# Patient Record
Sex: Female | Born: 1965 | Race: White | Hispanic: No | Marital: Married | Smoking: Never smoker
Health system: Southern US, Community
[De-identification: ages and names within clinical notes are randomized; demographics above are authoritative.]

---

## 1998-03-12 ENCOUNTER — Inpatient Hospital Stay (HOSPITAL_COMMUNITY): Admission: AD | Admit: 1998-03-12 | Discharge: 1998-03-14 | Payer: Self-pay | Admitting: Obstetrics & Gynecology

## 2002-02-14 ENCOUNTER — Inpatient Hospital Stay (HOSPITAL_COMMUNITY): Admission: EM | Admit: 2002-02-14 | Discharge: 2002-02-18 | Payer: Self-pay | Admitting: *Deleted

## 2002-02-21 ENCOUNTER — Encounter: Admission: RE | Admit: 2002-02-21 | Discharge: 2002-02-21 | Payer: Self-pay | Admitting: Internal Medicine

## 2002-03-12 ENCOUNTER — Emergency Department (HOSPITAL_COMMUNITY): Admission: EM | Admit: 2002-03-12 | Discharge: 2002-03-12 | Payer: Self-pay | Admitting: Emergency Medicine

## 2002-05-28 ENCOUNTER — Other Ambulatory Visit: Admission: RE | Admit: 2002-05-28 | Discharge: 2002-05-28 | Payer: Self-pay | Admitting: Obstetrics and Gynecology

## 2004-06-07 ENCOUNTER — Other Ambulatory Visit: Admission: RE | Admit: 2004-06-07 | Discharge: 2004-06-07 | Payer: Self-pay | Admitting: Obstetrics and Gynecology

## 2005-06-26 ENCOUNTER — Other Ambulatory Visit: Admission: RE | Admit: 2005-06-26 | Discharge: 2005-06-26 | Payer: Self-pay | Admitting: Obstetrics and Gynecology

## 2006-09-10 ENCOUNTER — Encounter (INDEPENDENT_AMBULATORY_CARE_PROVIDER_SITE_OTHER): Payer: Self-pay | Admitting: Specialist

## 2006-09-10 ENCOUNTER — Ambulatory Visit (HOSPITAL_COMMUNITY): Admission: RE | Admit: 2006-09-10 | Discharge: 2006-09-11 | Payer: Self-pay | Admitting: Obstetrics and Gynecology

## 2007-07-11 ENCOUNTER — Inpatient Hospital Stay (HOSPITAL_COMMUNITY): Admission: AD | Admit: 2007-07-11 | Discharge: 2007-07-11 | Payer: Self-pay | Admitting: Obstetrics and Gynecology

## 2008-12-09 IMAGING — US US TRANSVAGINAL NON-OB
1 series · 13 of 25 positions shown · non-contrast
Comparison: none

CLINICAL DATA: Left-sided pelvic pain.  Prior hysterectomy 
TRANSABDOMINAL AND TRANSVAGINAL PELVIC ULTRASOUND ? 07/11/07:
TECHNIQUE: Both transabdominal and transvaginal ultrasound examinations of the pelvis were performed including evaluation of the uterus, ovaries, adnexal regions, and pelvic cul-de-sac. 
ULTRASOUND ARTERIAL AND VENOUS DOPPLER ? 07/11/07:
TECHNIQUE: Arterial and venous assessment was carried out with color flow Doppler and waveform analysis.

[Series 1: us pelvis complete · 13 of 52 slices shown]
[im 1/52]
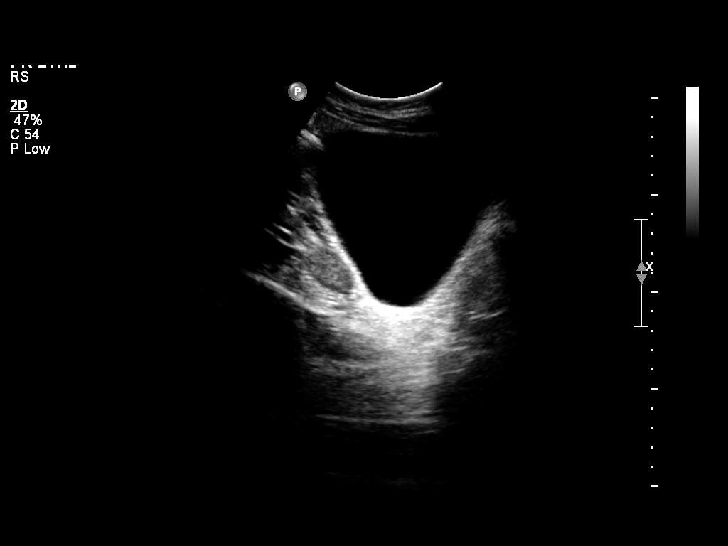
[im 5/52]
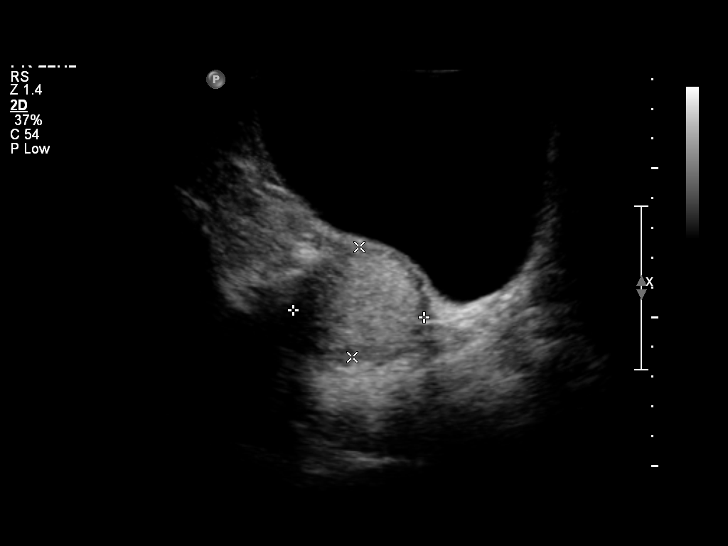
[im 9/52]
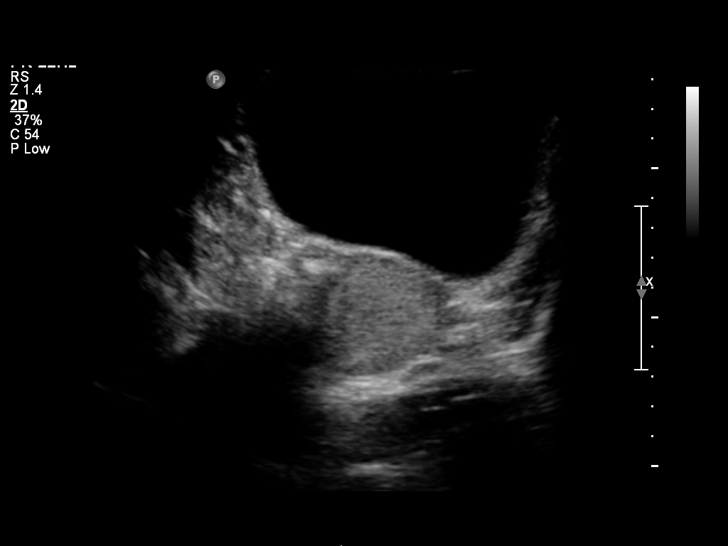
[im 13/52]
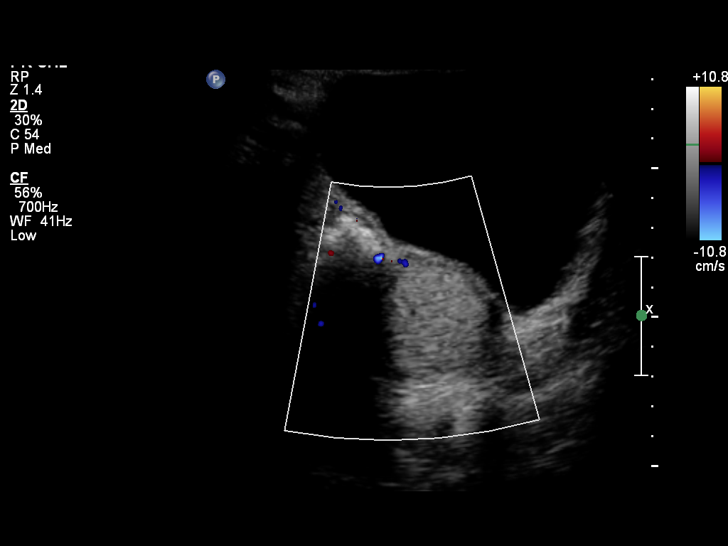
[im 18/52]
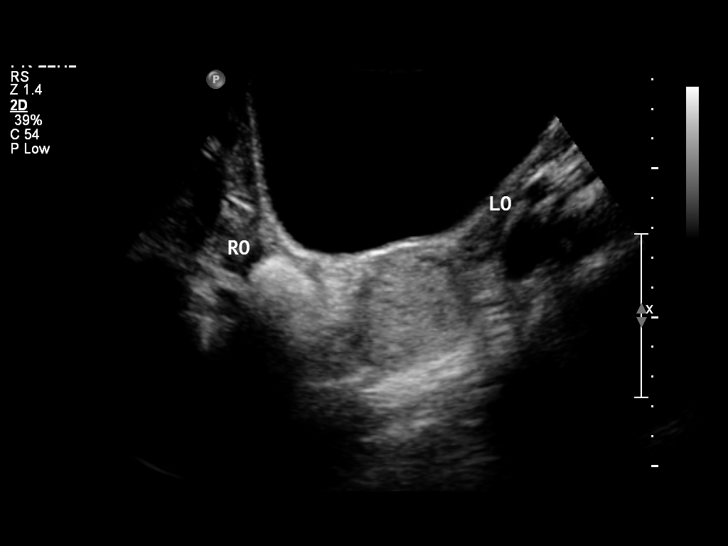
[im 22/52]
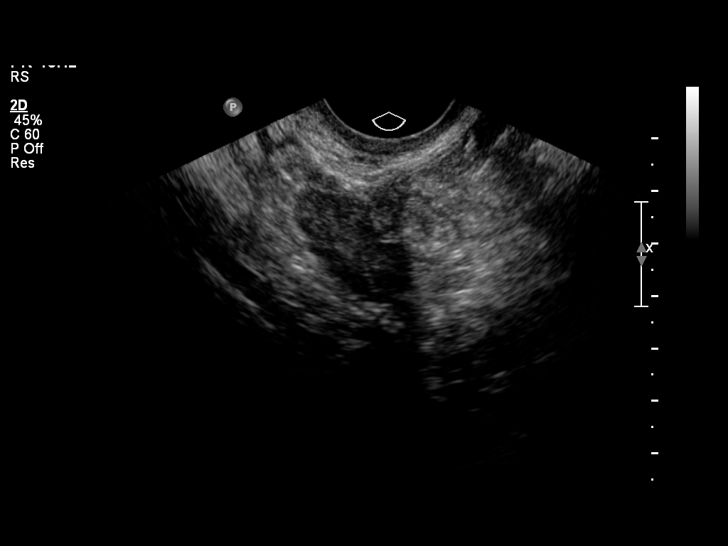
[im 26/52]
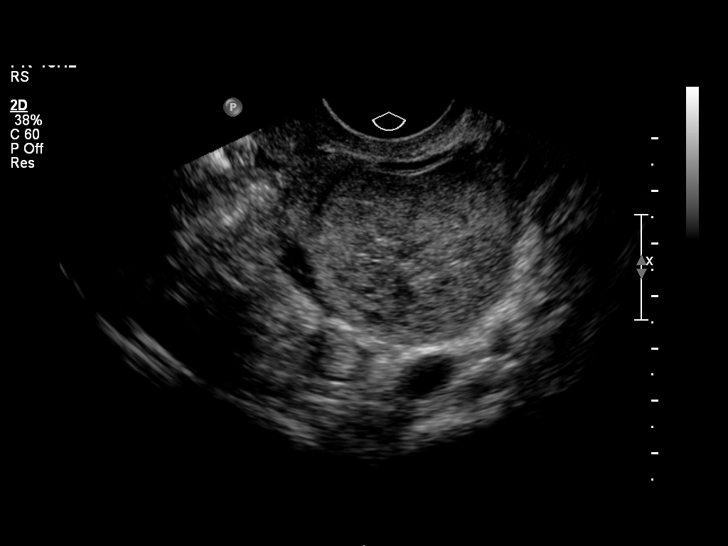
[im 30/52]
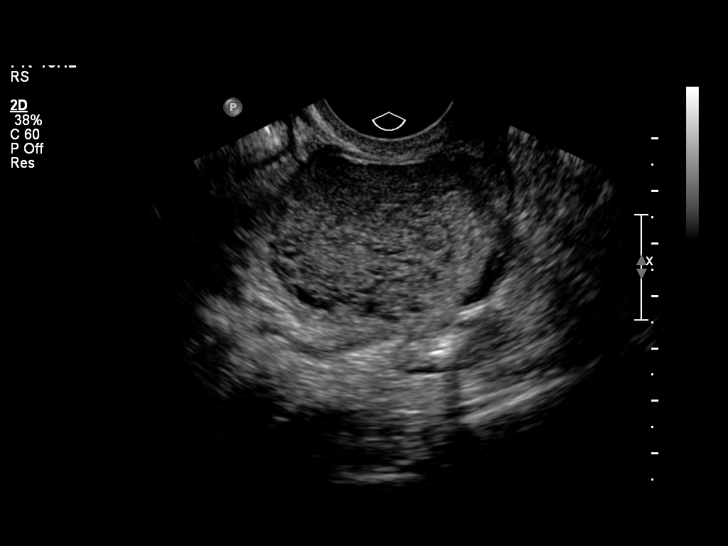
[im 35/52]
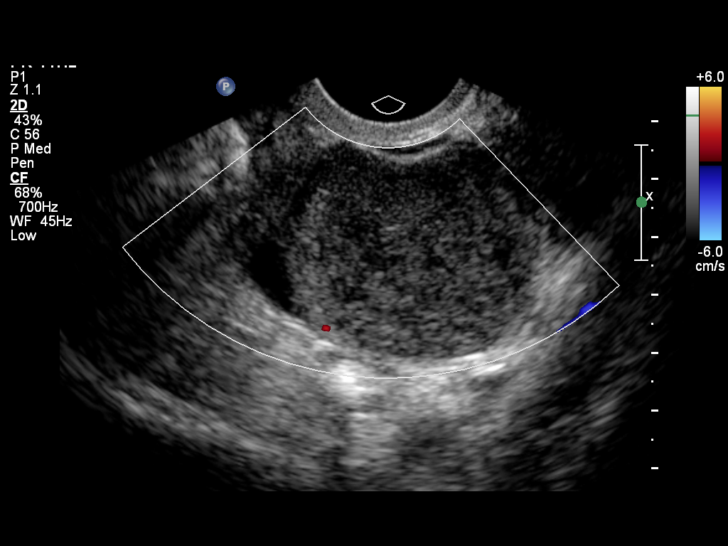
[im 39/52]
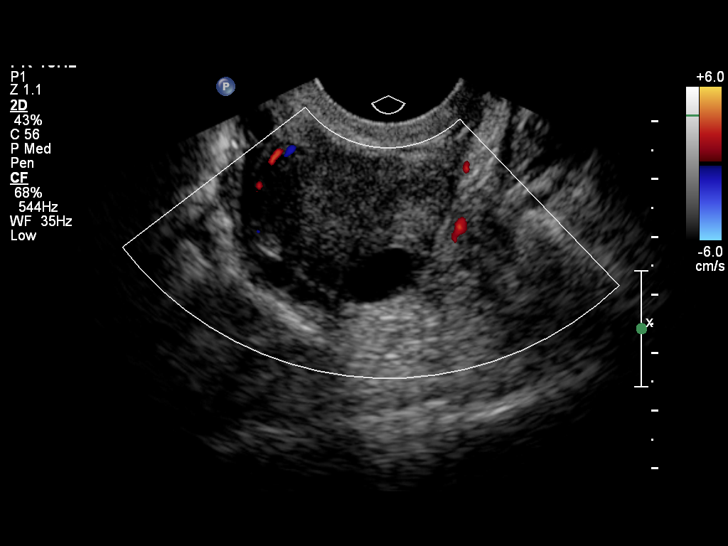
[im 43/52]
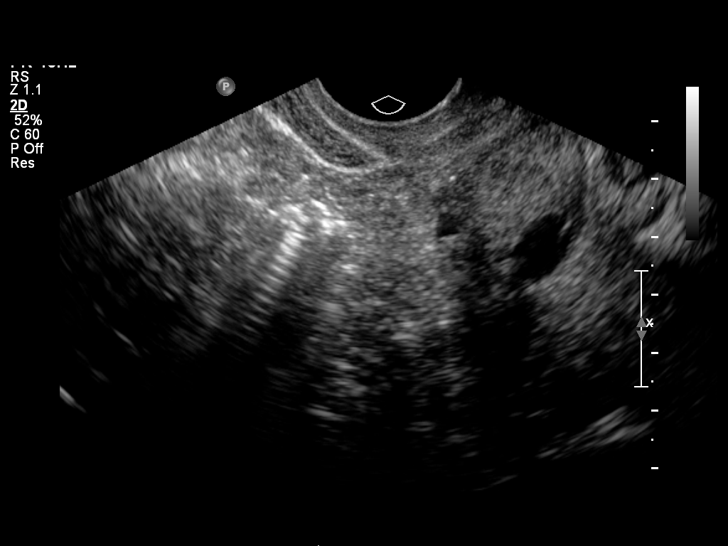
[im 47/52]
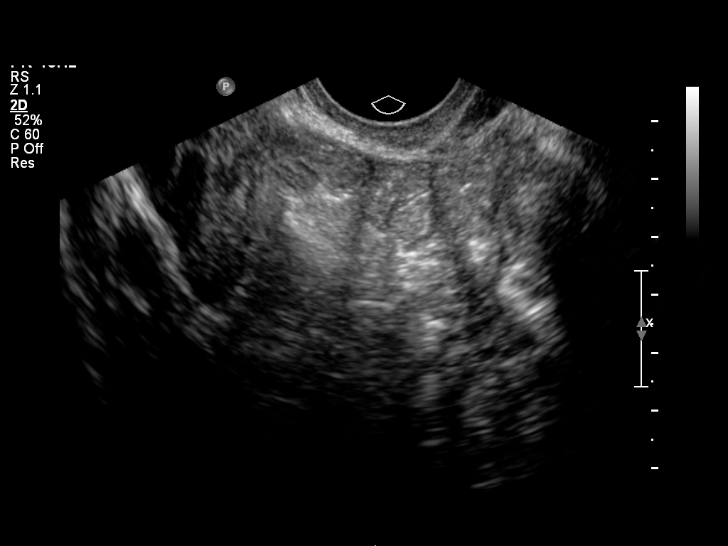
[im 52/52]
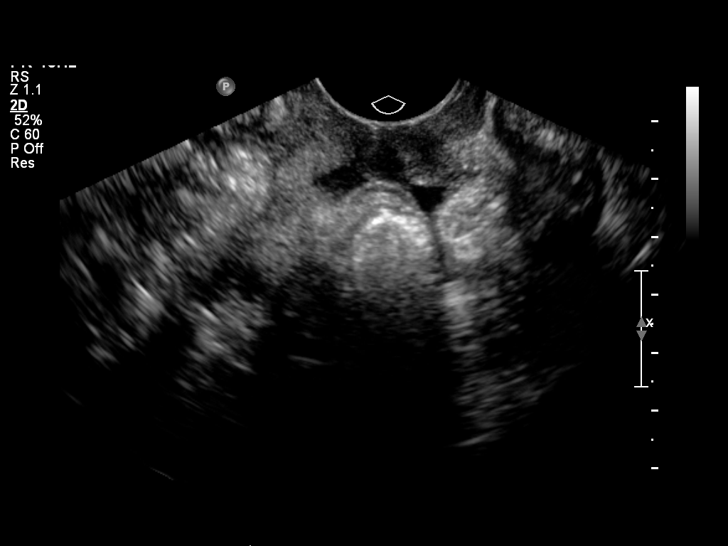

[13 of 25 positions shown; findings below may reference images not displayed]

FINDINGS: The uterus is surgically absent.  The right ovary is normal in appearance.  The left ovary measures 4.9 x 4.7 x 3.1 cm.  The left ovary is internally hypoechoic and heterogeneous in echotexture.  Pulsed Doppler interrogation demonstrates peripheral dampened low-resistance arterial flow.  The technologist did not obtain images of venous flow.  Trace free fluid is noted adjacent to the left ovary.
IMPRESSION: Enlarged left ovary with imaging appearance and clinical symptoms most typical for ovarian torsion, which may be incomplete/intermittent.  Other differential considerations include hemorrhagic cyst, solid mass such as fibroma/thecoma, or less likely endometrioma.  
Findings were called to Dr. El Agu by Dr. Wladyslaw on 07/11/07 at [DATE] p.m.

## 2010-12-30 NOTE — Op Note (Signed)
Morgan Tate, Morgan Tate             ACCOUNT NO.:  [id number]   MEDICAL RECORD NO.:  [id number]          PATIENT TYPE:  AMB   LOCATION:  SDC                           FACILITY:  [facility]   PHYSICIAN:  Mark C. Vernie Ammons, M.D.  DATE OF BIRTH:  Jul 12, 1966   DATE OF PROCEDURE:  09/10/2006  DATE OF DISCHARGE:                               OPERATIVE REPORT   PREOPERATIVE DIAGNOSIS:  Stress urinary continence.   POSTOPERATIVE DIAGNOSIS:  Stress urinary continence.   PROCEDURE:  Spark suprapubic sling.   SURGEON:  Mark C. Vernie Ammons, M.D.   ANESTHESIA:  General.   DRAINS:  16-French Foley catheter.   ESTIMATED BLOOD LOSS:  Approximately 10 mL.   SPECIMEN:  None.   COMPLICATIONS:  None.   INDICATIONS:  The patient is a 45 year old white female who was  originally seen by Dr. Jacquelyne Balint in office consultation for urinary  incontinence.  She leaked with coughing and sneezing, but not lifting,  bending, or lifting.  She has some mild urge incontinence.  She was  found on exam to have grade 2 hypermobility of the bladder neck.  No  rectocele or cystocele was found.  Cystoscopically, the bladder was  unremarkable, and she emptied her bladder well, with postvoid residual  of only 22 mL.  The treatment options were discussed with the patient.  She has elected to proceed with surgical correction.  Dr. Jacquelyne Balint was  not available to do the surgery.  The patient was counseled on this  fact, understands, and has elected to proceed with surgical correction  by me.   DESCRIPTION OF OPERATION:  After informed consent, the patient was  brought to the major OR, placed on the table, and administered general  anesthesia.  She was moved to the dorsal lithotomy position.  Dr.  Dareen Piano then performed a transvaginal hysterectomy without  complication, and this is  dictated by him separately.   A 16-French Foley catheter was placed in the bladder, and 0.5% Marcaine  with epinephrine was then used to infiltrate  the subvaginal mucosa in  the midline.  I also infiltrated a location two fingerbreadths from the  midline on either side just superior border of the symphysis pubis.   An incision was then made over the urethra at the mid-urethral level  which was determined by palpation and gentle traction on the Foley  catheter, pulling the balloon against the bladder neck.  I then  dissected laterally to expose the urethra and was able to situate my  index finger through the incision and palpate the inferior border of the  symphysis pubis in the mid-urethral region.  Attention was then directed  to the suprapubic region, and stab incisions were made in the previously  noted locations that were anesthetized.  I then drained the bladder  fully and completely through the catheter and passed the trocar through  the skin incisions, superior to the symphysis pubis and back behind the  symphysis pubis and directed this out the vaginal incision at the mid-  urethral level with digital guidance.  This was first performed on the  left and right sides.  I then removed the Foley catheter and performed  cystoscopy with the 21-French cystoscope and 70-degree lens.  The  bladder was noted be free of any tumor, stones or inflammatory lesions.  There was no evidence of perforation or injury to the bladder.  No  bleeding or foreign body were identified.  I therefore left the  cystoscope in place and affixed the Spark sling to each end of the  trocar and drew through these through the suprapubic incision.  I then  reinspected the bladder a second time, again noting no injury to the  bladder, no foreign body or sling material seen, and the urethra was  also noted to be normal in appearance upon removal of the cystoscope.  The catheter was then inserted into the bladder, and the sling was then  positioned over the mid-urethra.  Antibiotic solution was then instilled  down each side of the plastic sheathing of the sling  material, and the  plastic sheathing was first removed on the right side.  I then adjusted  tension so the sling laid against the urethra with no tension and  removed the left-sided plastic sheathing of the sling.  Reinspection  revealed the sling to lay in the exact mid-urethral region with no  tension.  The excess sling material was then excised from the suprapubic  incisions, and those were closed with Dermabond.  I irrigated the  vaginal incision copiously with antibiotic solution and then closed this  with running 2-0 Vicryl suture.  The Foley catheter was left indwelling.   The patient was awakened and taken to the recovery room in stable and  satisfactory condition.  She tolerated the procedure well with no  intraoperative complications.  She will be observed in the hospital with  a voiding trial in the morning.      Mark C. Vernie Ammons, M.D.  Electronically Signed     MCO/MEDQ  D:  09/10/2006  T:  09/10/2006  Job:  [id number]

## 2010-12-30 NOTE — H&P (Signed)
NAME:  Morgan Tate, Morgan Tate NO.:  [id number]   MEDICAL RECORD NO.:  [id number]         PATIENT TYPE:  WOIB   LOCATION:  SDC                           [facility]ACILITY:  [facility]   PHYSICIAN:  Malva Limes, M.D.    DATE OF BIRTH:  1966/05/02   DATE OF ADMISSION:  DATE OF DISCHARGE:                              HISTORY & PHYSICAL   HISTORY OF PRESENT ILLNESS:  Morgan Tate is a 45 year old white  female, gravida 3, para 3, who presents to [facility] for total  vaginal hysterectomy and sling urethropexy.  Morgan Tate has a four  to six year history of worsening dysmenorrhea and menorrhagia. She also  states over the last year and a half stress urinary incontinence has  significantly worsened.  The patient has been treated in the past with  oral contraceptive pills and Ponstel.  Recently the patient was given  the option of ablation, dilatation and curettage, or Mirena IUD.  The  patient elected to proceed with a total vaginal hysterectomy.   ALLERGIES:  SULFA.   HABITS:  She denies smoking or drug use.   PAST MEDICAL HISTORY:  The patient has had three vaginal deliveries,  tonsillectomy and tubal ligation.   FAMILY HISTORY:  Significant for colon cancer in her maternal aunt and  maternal grandmother.  There is also a family history of diabetes and  cardiovascular disease.   PHYSICAL EXAMINATION:  GENERAL:  The patient is a well-developed, well-  nourished white female weighing 150 pounds.  HEENT:  Normal.  LUNGS:  Clear to auscultation.  CARDIOVASCULAR:  Regular rate and rhythm without murmur.  BREASTS:  Without masses or tenderness. There is no lymphadenopathy.  ABDOMEN:  Without masses or tenderness.  PELVIC:  Normal external genitalia. Vagina is without lesions or  discharge.  There is no rectocele. The patient has a small cystocele.  The uterus is normal size and shape.  There are no adnexal masses.  RECTAL:  Within normal limits.   IMPRESSION:  1.  Menorrhagia.  2. Dysmenorrhea.  3. Stress urinary incontinence.   PLAN:  Proceed with total vaginal hysterectomy and sling urethropexy  which will be performed by Dr. Vernie Ammons.           ______________________________  Malva Limes, M.D.     MA/MEDQ  D:  09/09/2006  T:  09/09/2006  Job:  [id number]   cc:   Loraine Leriche C. Vernie Ammons, M.D.  Fax: [phone]

## 2010-12-30 NOTE — Discharge Summary (Signed)
NAMECARAN, STORCK NO.:  [id number]   MEDICAL RECORD NO.:  [id number]          PATIENT TYPE:  OIB   LOCATION:  9303                          [facility]ILITY:  [facility]   PHYSICIAN:  Malva Limes, M.D.    DATE OF BIRTH:  [birth year]   DATE OF ADMISSION:  09/10/2006  DATE OF DISCHARGE:  09/11/2006                               DISCHARGE SUMMARY   PRINCIPLE DISCHARGE DIAGNOSES:  1. Menorrhagia.  2. Dysmenorrhea.  3. Adenomyosis.  4. Stress urinary incontinence.   PROCEDURES:  1. Total vaginal hysterectomy.  2. SPARK urethropexy.   HISTORY OF PRESENT ILLNESS:  Mrs. Morgan Tate is a 45 year old white  female G3, P3 who presented to [facility] for total vaginal  hysterectomy and urethropexy secondary to a 46-year history of worsening  dysmenorrhea and menorrhagia.  The patient also had a year and a half  history of worsening stress urinary incontinence.  Complete description  of the events which led up to this hospitalization can be found in  dictated history and physical.  The patient underwent a total vaginal  hysterectomy without complications.  SPARK urethropexy was performed by  Dr. Ihor Gully.  Descriptions of these procedures are found in the  dictated operative note.  The patient's postoperative course was benign.  The patient's postoperative hemoglobin was 10.2.  She remains afebrile  throughout the hospitalization.  The patient was discharged to home on  postoperative day #2.  She was instructed to follow up with Dr. Vernie Ammons  in one week, and the GYN office in two weeks.  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           ______________________________  Malva Limes, M.D.     MA/MEDQ  D:  09/24/2006  T:  09/24/2006  Job:  [id number]

## 2010-12-30 NOTE — Discharge Summary (Signed)
[place]  Patient:    Morgan Tate, Morgan Tate Visit Number: [id number] MRN: [id number]          Service Type: MED Location: [phone] Attending Physician:  Farley Ly Admit Date:  02/14/2002 Discharge Date: 02/18/2002   CC:         Outpatient Clinic  Dr. Lacie Scotts in [place], [place]  Fransisco Hertz, M.D.   Discharge Summary  DISCHARGE DIAGNOSES: 1. Aseptic meningitis. 2. Iron-deficiency anemia.  DISCHARGE MEDICATIONS: 1. Tylenol p.r.n. pain. 2. Ferrous sulfate 325 mg p.o. q.d. 3. Vicodin one tablet for pain, no more than four pills a day.  CONDITION ON DISCHARGE:  Improved.  FOLLOWUP: 1. Asked to follow up in three days in the outpatient clinic with Dr. Daphine Deutscher. 2. In two weeks with her primary care physician in [place], [place].  PROCEDURES:  A CT without contrast was obtained which was basically normal.  A lumbar puncture was obtained which was uneventful, and the results of the lumbar puncture showed white blood cells of 726, neutrophils 63%, lymphocytes 17%, monocytes 20%, red blood cells 63, protein 149, glucose 51.  Opening pressure was 12 mmHg.  CONSULTATIONS:  We obtained an infectious disease consultation with Dr. Lina Sayre.  HISTORY OF PRESENT ILLNESS:  This is a 45 year old white female without significant past medical history who presented to the emergency department complaining of headaches that started a few days prior to admission that were almost permanent.  She used ibuprofen and Tylenol without relief.  She started having photophobia and ______.  She had sick contacts in the family, and because of no improvement in the past three days, she came to the emergency department.  PHYSICAL EXAMINATION:  GENERAL:  The patient was in real distress, laying in bed with eyes covered, complaining of severe headaches.  Alert and oriented x3.  NEUROLOGIC:  No focal neurological deficits.  Cranial  nerves III-XII were intact.  Positive ______.  No decreased strength on upper or lower extremities.  Toes were downgoing.  Reflexes were normal and symmetrical.  NECK:  Supple, no JVD, no carotid bruits.  LUNGS:  Clear to auscultation.  No wheezes, no rhonchi, no crackles.  HEART:  Regular rate and rhythm, tachycardic, no murmurs, rubs, or gallops.  ABDOMEN:  Soft, nontender, nondistended, bowel sounds were present.  EXTREMITIES:  Pulses intact and no edema.  ADMISSION LABORATORY DATA:  Hemoglobin 10.1, hematocrit 27.2, white blood cell count 6.6.  ANC 4.5.  MCV 69.3, platelets 259.  Blood cultures were drawn, they were all negative.  HOSPITAL COURSE:  #1 - MENINGITIS:  Because of first the findings on the lumbar puncture with a mixed picture, we could not totally rule out septic meningitis, bacterial meningitis, and we obtained Gram stain of the cerebrospinal fluid which was essentially negative.  We did cultures which returned back negative.  We started her on empiric antibiotics and she received ceftriaxone, vancomycin, ampicillin IV.  She was started on doxycycline IV because of [facility] Spotted Fever.  On the second day of admission she felt much better with headaches improved, and without photophobia or neck stiffness.  Infectious disease consultation was obtained, and the decision to discontinue all IV antibiotics was made, and because of the rapid clinical improvement, she was considered to have an aseptic meningitis, and was discharged home, and advised to follow up in four days.  #2 - ANEMIA:  Incidental finding during this admission.  It was worked up and turned out  to be microcytic.  Low ferratin, low iron, and she was given p.o. ferrous sulfate.  DISCHARGE LABORATORY DATA:  White blood cell count 5.3, hemoglobin 9, hematocrit 28.5, platelets 316.  Sodium 142, potassium 4, chloride 107, CO2 28, BUN 4, creatinine 0.7, glucose 110.Attending Physician:  Farley Ly DD:  02/21/02 TD:  02/25/02 Job: [phone] [phone]

## 2010-12-30 NOTE — Op Note (Signed)
NAMEJAZZLENE, Morgan Tate NO.:  [id number]   MEDICAL RECORD NO.:  [id number]          PATIENT TYPE:  AMB   LOCATION:  SDC                           [facility]ILITY[facility]   PHYSICIAN:  Morgan Tate, M.D.    DATE OF BIRTH:  [birth year]   DATE OF PROCEDURE:  09/10/2006  DATE OF DISCHARGE:                               OPERATIVE REPORT   PREOPERATIVE DIAGNOSIS:  1. Menorrhagia.  2. Dysmenorrhea.  3. Stress urinary incontinence.   POSTOPERATIVE DIAGNOSIS:  1. Menorrhagia.  2. Dysmenorrhea.  3. Stress urinary incontinence.   PROCEDURE:  Total vaginal hysterectomy.   SURGEON:  Dr. Dareen Piano   ASSISTANT:  Dr. Greta Doom   ANESTHESIA:  General endotracheal.   ANTIBIOTICS:  Ancef 1 gram.   DRAINS:  Foley bedside drainage.   ESTIMATED BLOOD LOSS:  150 mL.   SPECIMENS:  Cervix and uterus sent to pathology.   COMPLICATIONS:  None.   PROCEDURE:  The patient was taken to the operating room where she was  placed in dorsal supine position and general anesthetic was administered  without complication.  She was then placed in dorsal lithotomy position  and prepped with Betadine. Bladder was drained with the red rubber  catheter.  The patient was then draped in the usual fashion for this  procedure.  Weighted speculum placed in vagina. 10 mL of 1% lidocaine  with epinephrine was used for block. Posterior cul-de-sac was entered  sharply.  Uterosacral ligaments were bilaterally clamped, cut and  ligated with 0 Monocryl suture.  The cervix was then circumscribed. The  anterior cul-de-sac was entered sharply.  Bladder pillars were then  bilaterally clamped, cut and ligated with 0 Monocryl suture.  The  cardinal ligaments were serially clamped, cut and ligated with 0  Monocryl suture.  Uterine vessels were bilaterally clamped, cut and  ligated with 0 Monocryl suture.  Once the level of the round ligament  was reached.  The uterus was inverted and the triple pedicle which  included the  ovarian ligament, fallopian tube and round ligament were  bilaterally clamped, cut and ligated x2 with 0 Monocryl suture.  Pedicles were then checked and made hemostatic.  At this point the  posterior cuff was run  using 2-0 Vicryl in a running locking fashion. Remaining vaginal cuff  was closed vertically using 2-0 Monocryl in a running locking fashion.  The patient tolerated procedure well.  At that point Dr. Vernie Tate came  into the operating room where he will perform a sling urethropexy.  Complete description of his be dictated in his operative note.           ______________________________  Morgan Tate, M.D.     MA/MEDQ  D:  09/10/2006  T:  09/10/2006  Job:  [id number]   cc:   Loraine Leriche C. Morgan Tate, M.D.  Fax: [phone]

## 2011-05-01 ENCOUNTER — Other Ambulatory Visit: Payer: Self-pay | Admitting: Obstetrics and Gynecology

## 2011-05-23 LAB — URINALYSIS, ROUTINE W REFLEX MICROSCOPIC
Bilirubin Urine: NEGATIVE
Ketones, ur: NEGATIVE
Nitrite: NEGATIVE
Urobilinogen, UA: 0.2
pH: 7.5

## 2014-06-04 ENCOUNTER — Other Ambulatory Visit: Payer: Self-pay | Admitting: Obstetrics and Gynecology

## 2015-02-07 ENCOUNTER — Emergency Department (HOSPITAL_COMMUNITY)
Admission: EM | Admit: 2015-02-07 | Discharge: 2015-02-07 | Disposition: A | Discharge status: Home / Self Care | Payer: Managed Care, Other (non HMO) | Attending: Emergency Medicine | Admitting: Emergency Medicine

## 2015-02-07 ENCOUNTER — Emergency Department (HOSPITAL_COMMUNITY): Payer: Managed Care, Other (non HMO)

## 2015-02-07 ENCOUNTER — Encounter (HOSPITAL_COMMUNITY): Payer: Self-pay | Admitting: Emergency Medicine

## 2015-02-07 DIAGNOSIS — Z90710 Acquired absence of both cervix and uterus: Secondary | ICD-10-CM | POA: Diagnosis not present

## 2015-02-07 DIAGNOSIS — R079 Chest pain, unspecified: Secondary | ICD-10-CM

## 2015-02-07 DIAGNOSIS — R0789 Other chest pain: Secondary | ICD-10-CM

## 2015-02-07 DIAGNOSIS — Z79899 Other long term (current) drug therapy: Secondary | ICD-10-CM | POA: Diagnosis not present

## 2015-02-07 DIAGNOSIS — R1011 Right upper quadrant pain: Secondary | ICD-10-CM | POA: Diagnosis present

## 2015-02-07 LAB — COMPREHENSIVE METABOLIC PANEL
ALBUMIN: 3.9 g/dL (ref 3.5–5.0)
ALT: 39 U/L (ref 14–54)
ANION GAP: 8 (ref 5–15)
AST: 38 U/L (ref 15–41)
Alkaline Phosphatase: 60 U/L (ref 38–126)
BILIRUBIN TOTAL: 0.6 mg/dL (ref 0.3–1.2)
BUN: 15 mg/dL (ref 6–20)
CO2: 27 mmol/L (ref 22–32)
Calcium: 9.3 mg/dL (ref 8.9–10.3)
Chloride: 102 mmol/L (ref 101–111)
Creatinine, Ser: 0.92 mg/dL (ref 0.44–1.00)
Glucose, Bld: 96 mg/dL (ref 65–99)
Potassium: 3.7 mmol/L (ref 3.5–5.1)
SODIUM: 137 mmol/L (ref 135–145)
Total Protein: 6.8 g/dL (ref 6.5–8.1)

## 2015-02-07 LAB — CBC
HEMATOCRIT: 38.1 % (ref 36.0–46.0)
Hemoglobin: 12.7 g/dL (ref 12.0–15.0)
MCH: 28.4 pg (ref 26.0–34.0)
MCHC: 33.3 g/dL (ref 30.0–36.0)
MCV: 85.2 fL (ref 78.0–100.0)
Platelets: 205 10*3/uL (ref 150–400)
RBC: 4.47 MIL/uL (ref 3.87–5.11)
RDW: 14 % (ref 11.5–15.5)
WBC: 5.3 10*3/uL (ref 4.0–10.5)

## 2015-02-07 MED ORDER — IBUPROFEN 800 MG PO TABS
800.0000 mg | ORAL_TABLET | Freq: Once | ORAL | Status: AC
  Administered 2015-02-07: 800 mg via ORAL
  Filled 2015-02-07: qty 1

## 2015-02-07 MED ORDER — IBUPROFEN 800 MG PO TABS: 800.0000 mg | ORAL_TABLET | Freq: Three times a day (TID) | ORAL | Status: AC

## 2015-02-07 MED ORDER — METHOCARBAMOL 500 MG PO TABS: 500.0000 mg | ORAL_TABLET | Freq: Two times a day (BID) | ORAL | Status: AC | PRN

## 2015-02-07 NOTE — Discharge Instructions (Signed)
Please call your doctor for a followup appointment within 24-48 hours. When you talk to your doctor please let them know that you were seen in the emergency department and have them acquire all of your records so that they can discuss the findings with you and formulate a treatment plan to fully care for your new and ongoing problems. ° °

## 2015-02-07 NOTE — ED Notes (Signed)
Pt. Stated, I've been hurting in my upper right side of my abdomen.

## 2015-02-07 NOTE — ED Provider Notes (Signed)
CSN: [id number]     Arrival date & time 02/07/15  1258 History   First MD Initiated Contact with Patient 02/07/15 1313     Chief Complaint  Patient presents with  . Abdominal Pain    on the right upper side     (Consider location/radiation/quality/duration/timing/severity/associated sxs/prior Treatment) HPI Comments: The patient is a 49 year old female who is totally healthy, no medical problems, no daily medications, had acute onset of pain in the right anterior lower ribs which occurred approximately one hour ago. This has been mildly persistent however when she leans to the right or crouches to the right she has worsening pain. It came on while she was walking, she was not straining, has no shortness of breath, denies chest pain, denies abdominal discomfort, has no nausea vomiting diarrhea dysuria or any other complaints. She has had an abdominal hysterectomy but no other abdominal surgery. Her symptoms are very mild at this time  Patient is a 49 y.o. female presenting with abdominal pain. The history is provided by the patient.  Abdominal Pain   History reviewed. No pertinent past medical history. History reviewed. No pertinent past surgical history. No family history on file. History  Substance Use Topics  . Smoking status: Never Smoker   . Smokeless tobacco: Not on file  . Alcohol Use: No   OB History    No data available     Review of Systems  Gastrointestinal: Positive for abdominal pain.  All other systems reviewed and are negative.     Allergies  Cefdinir and Sulfa antibiotics  Home Medications   Prior to Admission medications   Medication Sig Start Date End Date Taking? Authorizing Provider  loratadine (CLARITIN) 10 MG tablet Take 10 mg by mouth daily.   Yes Historical Provider, MD  OVER THE COUNTER MEDICATION Take 1 tablet by mouth every morning. Nerium EHT Dietary Supplement   Yes Historical Provider, MD  ibuprofen (ADVIL,MOTRIN) 800 MG tablet Take 1 tablet  (800 mg total) by mouth 3 (three) times daily. 02/07/15   Eber Hong, MD  methocarbamol (ROBAXIN) 500 MG tablet Take 1 tablet (500 mg total) by mouth 2 (two) times daily as needed for muscle spasms. 02/07/15   Eber Hong, MD   BP 111/74 mmHg  Pulse 72  Temp(Src) 98.2 F (36.8 C) (Oral)  Resp 18  Ht  (1.651 m)  Wt 161 lb (73.029 kg)  BMI 26.79 kg/m2  SpO2 97% Physical Exam  Constitutional: She appears well-developed and well-nourished. No distress.  HENT:  Head: Normocephalic and atraumatic.  Mouth/Throat: Oropharynx is clear and moist. No oropharyngeal exudate.  Eyes: Conjunctivae and EOM are normal. Pupils are equal, round, and reactive to light. Right eye exhibits no discharge. Left eye exhibits no discharge. No scleral icterus.  Neck: Normal range of motion. Neck supple. No JVD present. No thyromegaly present.  Cardiovascular: Normal rate, regular rhythm, normal heart sounds and intact distal pulses.  Exam reveals no gallop and no friction rub.   No murmur heard. Pulmonary/Chest: Effort normal and breath sounds normal. No respiratory distress. She has no wheezes. She has no rales.  Abdominal: Soft. Bowel sounds are normal. She exhibits no distension and no mass. There is no tenderness.  Tenderness is not reproducible to palpation over the abdominal wall, she has no carnett's sign, she has clear lung sounds, she has mild tenderness over the right ribs when she leans to the right only.  Musculoskeletal: Normal range of motion. She exhibits no edema or tenderness.  Lymphadenopathy:    She has no cervical adenopathy.  Neurological: She is alert. Coordination normal.  Skin: Skin is warm and dry. No rash noted. No erythema.  Psychiatric: She has a normal mood and affect. Her behavior is normal.  Nursing note and vitals reviewed.   ED Course  Procedures (including critical care time) Labs Review Labs Reviewed  COMPREHENSIVE METABOLIC PANEL  CBC    Imaging Review Dg Chest  2 View  02/07/2015   CLINICAL DATA:  Right lower anterior chest pain since this morning. Shortness of breath.  EXAM: CHEST  2 VIEW  COMPARISON:  None.  FINDINGS: The heart size and mediastinal contours are within normal limits. Both lungs are clear. The visualized skeletal structures are unremarkable.  IMPRESSION: Normal chest.   Electronically Signed   By: Francene Boyers M.D.   On: 02/07/2015 14:14     EKG Interpretation   Date/Time:  Sunday February 07 2015 13:09:57 EDT Ventricular Rate:  81 PR Interval:  142 QRS Duration: 78 QT Interval:  434 QTC Calculation: 504 R Axis:   73 Text Interpretation:  Normal sinus rhythm Nonspecific ST abnormality  Prolonged QT Abnormal ECG Baseline wander No old tracing to compare  Confirmed by Jaxon Flatt  MD, Daje Stark ([id number]) on 02/07/2015 1:28:40 PM      MDM   Final diagnoses:  Chest pain  Chest wall pain    No Murphy sign, no pain in McBurney's point, mild tenderness only when she leans to the right, likely musculoskeletal etiology, less likely to be kidney stone given location, gall stone given no postprandial pain, vital signs unremarkable, labs and x-ray pending, if labs are positive for gait ultrasound otherwise anticipate discharge with anterior inflammatory. The patient is in agreement with the plan.  Xray and labs normal.  I have personally viewed and interpreted the imaging and agree with radiologist interpretation.  Ibuprofen given, pt states that the pain became worse when she went for xray of her chest and had to lift her arms.  It is now 5/10 when laying still, 10/10 when she rolls on her R side down.  Likely MSK type pain - testing unremarkable.  Meds given in ED:  Medications  ibuprofen (ADVIL,MOTRIN) tablet 800 mg (not administered)    New Prescriptions   IBUPROFEN (ADVIL,MOTRIN) 800 MG TABLET    Take 1 tablet (800 mg total) by mouth 3 (three) times daily.   METHOCARBAMOL (ROBAXIN) 500 MG TABLET    Take 1 tablet (500 mg total) by mouth  2 (two) times daily as needed for muscle spasms.      Eber Hong, MD 02/07/15 [phone]

## 2016-07-08 IMAGING — CR DG CHEST 2V
2 series · 2 of 2 positions shown · non-contrast
Comparison: None.

CLINICAL DATA: Right lower anterior chest pain since this morning.
Shortness of breath.

EXAM:
CHEST  2 VIEW

[chest pa]
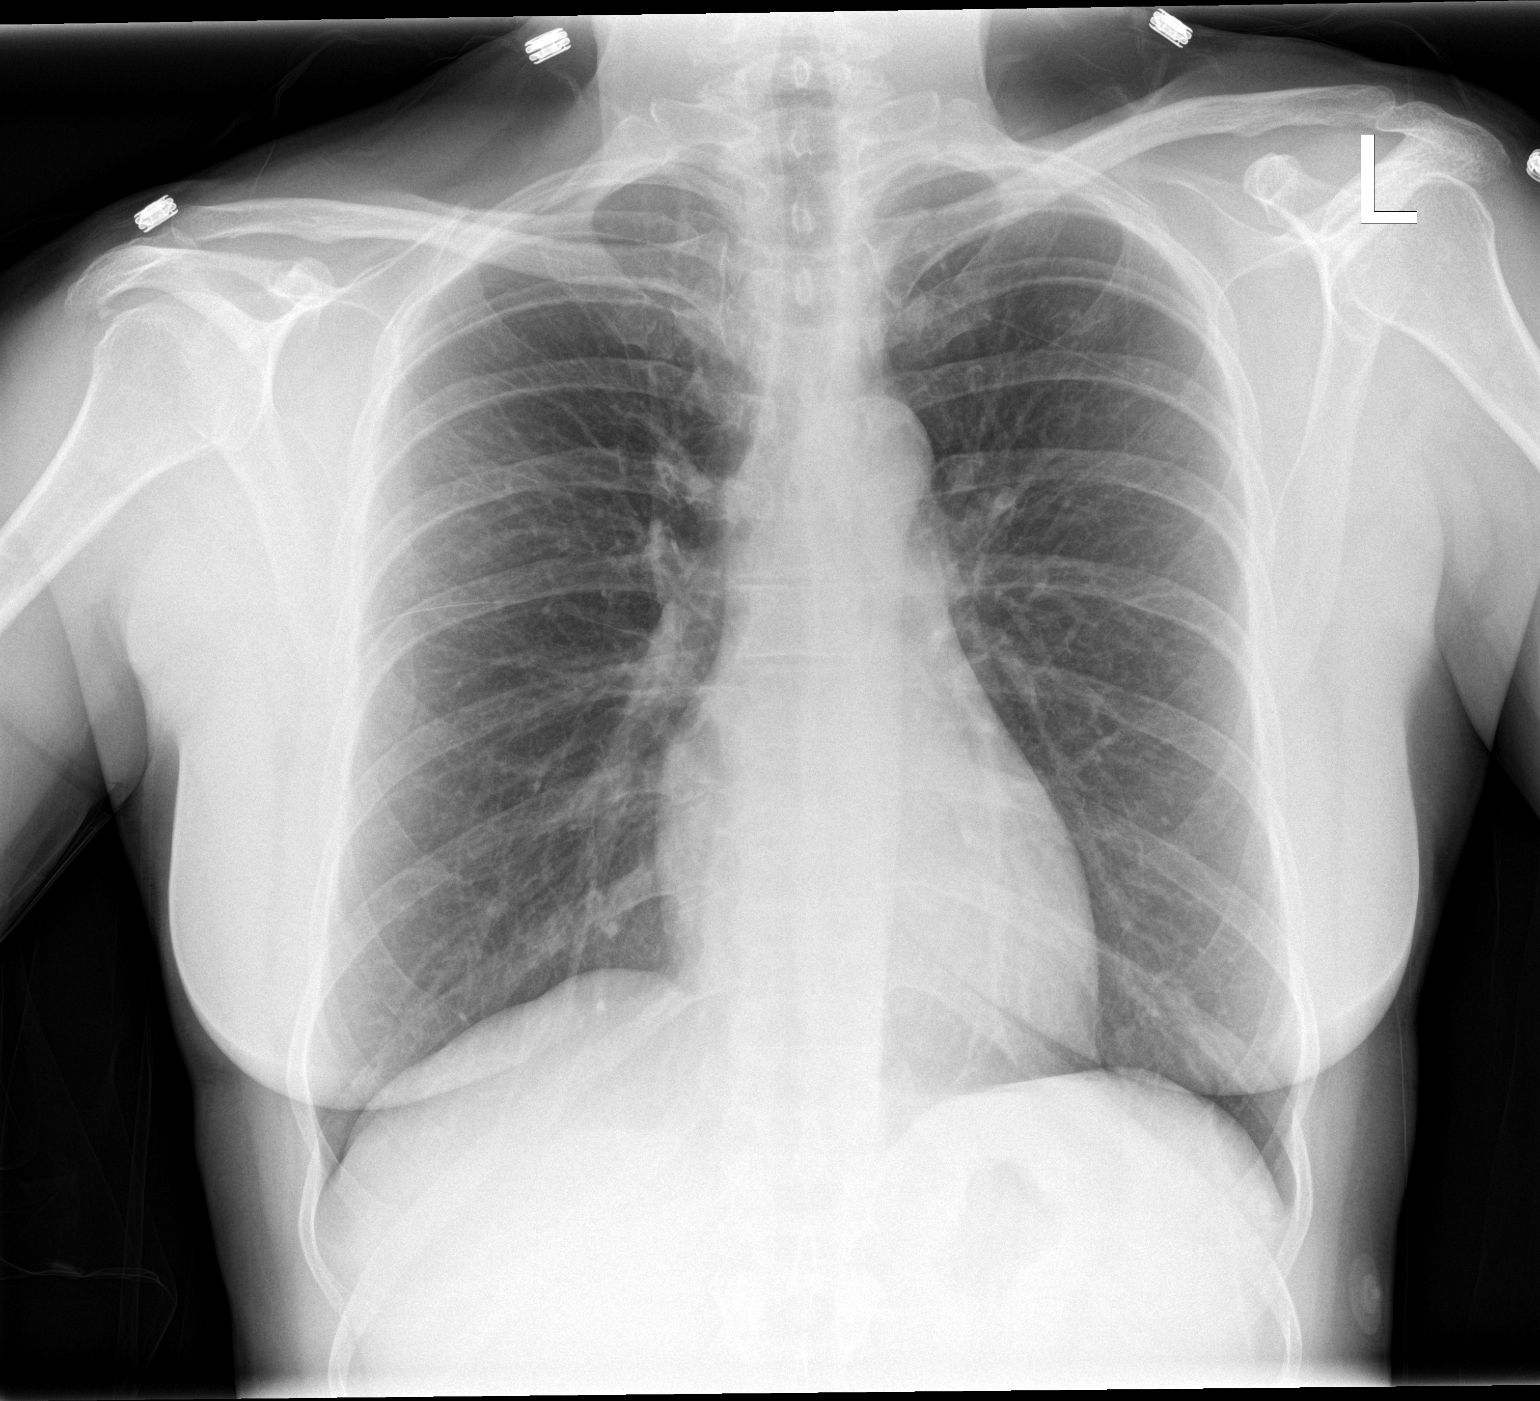

[chest lat]
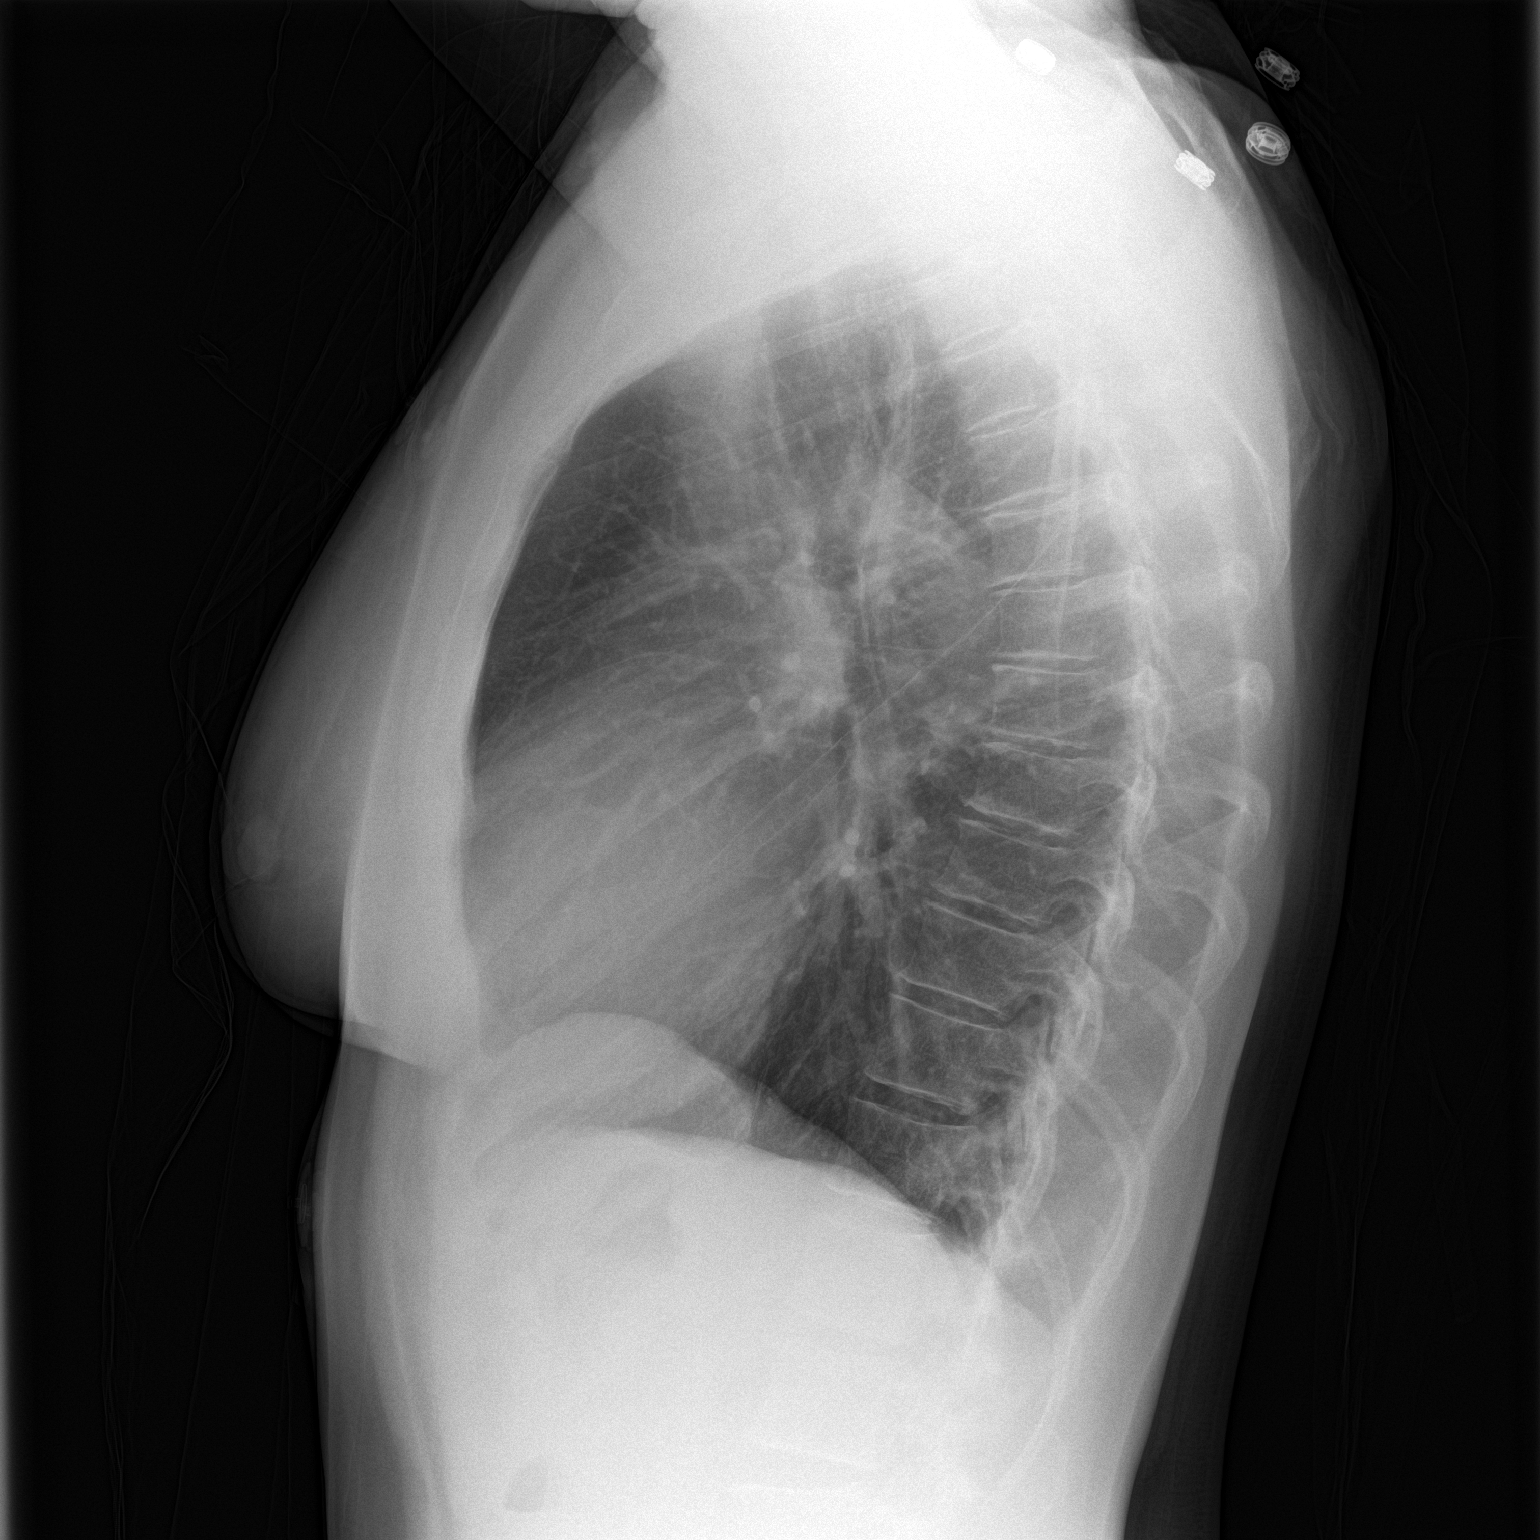

[2 of 2 positions shown; findings below may reference images not displayed]

FINDINGS: The heart size and mediastinal contours are within normal limits.
Both lungs are clear. The visualized skeletal structures are
unremarkable.
IMPRESSION: Normal chest.

## 2020-04-23 ENCOUNTER — Other Ambulatory Visit: Payer: Self-pay | Admitting: Nurse Practitioner

## 2020-04-23 DIAGNOSIS — Z6826 Body mass index (BMI) 26.0-26.9, adult: Secondary | ICD-10-CM

## 2020-04-23 DIAGNOSIS — U071 COVID-19: Secondary | ICD-10-CM

## 2020-04-23 NOTE — Progress Notes (Signed)
I connected by phone with Morgan Tate on 04/23/2020 at 2:44 PM to discuss the potential use of a new treatment for mild to moderate COVID-19 viral infection in non-hospitalized patients.  This patient is a 54 y.o. female that meets the FDA criteria for Emergency Use Authorization of COVID monoclonal antibody casirivimab/imdevimab.  Has a (+) direct SARS-CoV-2 viral test result  Has mild or moderate COVID-19   Is NOT hospitalized due to COVID-19  Is within 10 days of symptom onset  Has at least one of the high risk factor(s) for progression to severe COVID-19 and/or hospitalization as defined in EUA.  Specific high risk criteria : BMI > 25   I have spoken and communicated the following to the patient or parent/caregiver regarding COVID monoclonal antibody treatment:  1. FDA has authorized the emergency use for the treatment of mild to moderate COVID-19 in adults and pediatric patients with positive results of direct SARS-CoV-2 viral testing who are 59 years of age and older weighing at least 40 kg, and who are at high risk for progressing to severe COVID-19 and/or hospitalization.  2. The significant known and potential risks and benefits of COVID monoclonal antibody, and the extent to which such potential risks and benefits are unknown.  3. Information on available alternative treatments and the risks and benefits of those alternatives, including clinical trials.  4. Patients treated with COVID monoclonal antibody should continue to self-isolate and use infection control measures (e.g., wear mask, isolate, social distance, avoid sharing personal items, clean and disinfect "high touch" surfaces, and frequent handwashing) according to CDC guidelines.   5. The patient or parent/caregiver has the option to accept or refuse COVID monoclonal antibody treatment.  After reviewing this information with the patient, The patient agreed to proceed with receiving casirivimab\imdevimab infusion  and will be provided a copy of the Fact sheet prior to receiving the infusion.  Sx onset 04/22/20. Set up for infusion on 04/24/20 12:30 pm. Directions given to [facility]. Pt is aware that insurance will be charged an infusion fee.   Sherril Cong 04/23/2020 2:44 PM

## 2020-04-24 ENCOUNTER — Ambulatory Visit (HOSPITAL_COMMUNITY)
Admission: RE | Admit: 2020-04-24 | Discharge: 2020-04-24 | Disposition: A | Discharge status: Home / Self Care | Payer: HRSA Program | Source: Ambulatory Visit | Attending: Pulmonary Disease | Admitting: Pulmonary Disease

## 2020-04-24 ENCOUNTER — Other Ambulatory Visit (HOSPITAL_COMMUNITY): Payer: Self-pay

## 2020-04-24 DIAGNOSIS — Z6826 Body mass index (BMI) 26.0-26.9, adult: Secondary | ICD-10-CM

## 2020-04-24 DIAGNOSIS — U071 COVID-19: Secondary | ICD-10-CM | POA: Diagnosis not present

## 2020-04-24 MED ORDER — EPINEPHRINE 0.3 MG/0.3ML IJ SOAJ: 0.3000 mg | Freq: Once | INTRAMUSCULAR | Status: DC | PRN

## 2020-04-24 MED ORDER — ALBUTEROL SULFATE HFA 108 (90 BASE) MCG/ACT IN AERS: 2.0000 | INHALATION_SPRAY | Freq: Once | RESPIRATORY_TRACT | Status: DC | PRN

## 2020-04-24 MED ORDER — DIPHENHYDRAMINE HCL 50 MG/ML IJ SOLN: 50.0000 mg | Freq: Once | INTRAMUSCULAR | Status: DC | PRN

## 2020-04-24 MED ORDER — SODIUM CHLORIDE 0.9 % IV SOLN
1200.0000 mg | Freq: Once | INTRAVENOUS | Status: AC
  Administered 2020-04-24: 1200 mg via INTRAVENOUS

## 2020-04-24 MED ORDER — FAMOTIDINE IN NACL 20-0.9 MG/50ML-% IV SOLN: 20.0000 mg | Freq: Once | INTRAVENOUS | Status: DC | PRN

## 2020-04-24 MED ORDER — METHYLPREDNISOLONE SODIUM SUCC 125 MG IJ SOLR: 125.0000 mg | Freq: Once | INTRAMUSCULAR | Status: DC | PRN

## 2020-04-24 MED ORDER — SODIUM CHLORIDE 0.9 % IV SOLN: INTRAVENOUS | Status: DC | PRN

## 2020-04-24 NOTE — Progress Notes (Signed)
  Diagnosis: COVID-19  Physician: Dr. Wright  Procedure: [facility] Med: casirivimab\imdevimab infusion - Provided patient with casirivimab\imdevimab fact sheet for patients, parents and caregivers prior to infusion.  Complications: No immediate complications noted.  Discharge: Discharged home   Damarco Keysor M Emonee Winkowski 04/24/2020  

## 2020-04-24 NOTE — Discharge Instructions (Signed)

## 2022-05-04 ENCOUNTER — Encounter (HOSPITAL_BASED_OUTPATIENT_CLINIC_OR_DEPARTMENT_OTHER): Payer: Self-pay | Admitting: Emergency Medicine

## 2022-05-04 ENCOUNTER — Other Ambulatory Visit: Payer: Self-pay

## 2022-05-04 ENCOUNTER — Emergency Department (HOSPITAL_BASED_OUTPATIENT_CLINIC_OR_DEPARTMENT_OTHER)
Admission: EM | Admit: 2022-05-04 | Discharge: 2022-05-04 | Disposition: A | Discharge status: Home / Self Care | Payer: Self-pay | Attending: Emergency Medicine | Admitting: Emergency Medicine

## 2022-05-04 DIAGNOSIS — R220 Localized swelling, mass and lump, head: Secondary | ICD-10-CM

## 2022-05-04 DIAGNOSIS — K047 Periapical abscess without sinus: Secondary | ICD-10-CM

## 2022-05-04 MED ORDER — HYDROCODONE-ACETAMINOPHEN 5-325 MG PO TABS
1.0000 | ORAL_TABLET | Freq: Once | ORAL | Status: AC
  Administered 2022-05-04: 1 via ORAL
  Filled 2022-05-04: qty 1

## 2022-05-04 MED ORDER — CLINDAMYCIN HCL 300 MG PO CAPS: 300.0000 mg | ORAL_CAPSULE | Freq: Four times a day (QID) | ORAL | 0 refills | Status: DC

## 2022-05-04 MED ORDER — HYDROCODONE-ACETAMINOPHEN 5-325 MG PO TABS: 1.0000 | ORAL_TABLET | ORAL | 0 refills | Status: AC | PRN

## 2022-05-04 NOTE — ED Triage Notes (Signed)
Pt POV c/o left facial swelling and dental pain x1 week. Recently had dental cleaning one week ago, swelling started after. Prescribed antibiotics, no relief.  Root canal today, no relief.  Dentist referred pt to ED.

## 2022-05-04 NOTE — Discharge Instructions (Addendum)
Take clindamycin 4 times a day.  Warm gargles 5 minutes every hour today.  Pain medication as needed

## 2022-05-04 NOTE — ED Provider Notes (Signed)
[facility] EMERGENCY DEPARTMENT Provider Note   CSN: [id number] Arrival date & time: 05/04/22  1512     History  Chief Complaint  Patient presents with   Facial Swelling    Morgan Tate is a 56 y.o. female.  Patient complains of swelling and dental pain for the past week.  She saw her dentist today he did a root canal and an I&D of abscessed area.  Patient complains of pain unrelieved by Tylenol or ibuprofen.  She reports her dentist did not get very much drainage from the incision.  Patient reports her face is swollen and tender        Home Medications Prior to Admission medications   Medication Sig Start Date End Date Taking? Authorizing Provider  clindamycin (CLEOCIN) 300 MG capsule Take 1 capsule (300 mg total) by mouth 4 (four) times daily for 12 days. 05/04/22 05/16/22 Yes Elson Areas, PA-C  HYDROcodone-acetaminophen (NORCO/VICODIN) 5-325 MG tablet Take 1 tablet by mouth every 4 (four) hours as needed for moderate pain. 05/04/22 05/04/23 Yes Elson Areas, PA-C  ibuprofen (ADVIL,MOTRIN) 800 MG tablet Take 1 tablet (800 mg total) by mouth 3 (three) times daily. 02/07/15   Eber Hong, MD  loratadine (CLARITIN) 10 MG tablet Take 10 mg by mouth daily.    [provider]  methocarbamol (ROBAXIN) 500 MG tablet Take 1 tablet (500 mg total) by mouth 2 (two) times daily as needed for muscle spasms. 02/07/15   Eber Hong, MD  OVER THE COUNTER MEDICATION Take 1 tablet by mouth every morning. Nerium EHT Dietary Supplement    [provider]      Allergies    Cefdinir and Sulfa antibiotics    Review of Systems   Review of Systems  All other systems reviewed and are negative.   Physical Exam Updated Vital Signs BP (!) 148/102 (BP Location: Right Arm)   Pulse 92   Temp 98.3 F (36.8 C) (Oral)   Resp (!) 21   SpO2 100%  Physical Exam Vitals and nursing note reviewed.  Constitutional:      Appearance: She is well-developed.  HENT:      Head: Normocephalic.     Mouth/Throat:     Mouth: Mucous membranes are moist.     Comments: Swelling left upper gumline there is a small incision upper alveolar edge.  Pointing area gumline near the incision opened and drained when I examined with tongue depressor.  I pushed on area and was able to express purulent drainage Eyes:     Pupils: Pupils are equal, round, and reactive to light.  Pulmonary:     Effort: Pulmonary effort is normal.  Abdominal:     General: There is no distension.  Musculoskeletal:        General: Normal range of motion.     Cervical back: Normal range of motion.  Neurological:     Mental Status: She is alert and oriented to person, place, and time.     ED Results / Procedures / Treatments   Labs (all labs ordered are listed, but only abnormal results are displayed) Labs Reviewed - No data to display  EKG None  Radiology No results found.  Procedures Procedures    Medications Ordered in ED Medications  HYDROcodone-acetaminophen (NORCO/VICODIN) 5-325 MG per tablet 1 tablet (1 tablet Oral Given 05/04/22 1622)    ED Course/ Medical Decision Making/ A&P  Medical Decision Making Patient has swelling and dental pain she had a root canal and an I&D done today  Risk Prescription drug management. Risk Details: Patient given hydrocodone here patient is advised warm salt water gargles she is given a prescription for 10 hydrocodone and a prescription for clindamycin she is advised to take the clindamycin 300 mg 4 times a day she is to follow-up with her physician for recheck           Final Clinical Impression(s) / ED Diagnoses Final diagnoses:  Dental abscess  Facial swelling    Rx / DC Orders ED Discharge Orders          Ordered    HYDROcodone-acetaminophen (NORCO/VICODIN) 5-325 MG tablet  Every 4 hours PRN        05/04/22 1610    clindamycin (CLEOCIN) 300 MG capsule  4 times daily        05/04/22 1610            An After Visit Summary was printed and given to the patient.    Fransico Meadow, Hershal Coria 05/04/22 1905    Drenda Freeze, MD 05/04/22 [phone]

## 2022-05-05 ENCOUNTER — Telehealth (HOSPITAL_BASED_OUTPATIENT_CLINIC_OR_DEPARTMENT_OTHER): Payer: Self-pay | Admitting: Emergency Medicine

## 2022-05-05 MED ORDER — CLINDAMYCIN HCL 300 MG PO CAPS: 300.0000 mg | ORAL_CAPSULE | Freq: Four times a day (QID) | ORAL | 0 refills | Status: AC

## 2022-05-05 NOTE — Telephone Encounter (Signed)
Secretary received phone call from patient reporting that her quantity of her antibiotic was insufficient.  Reviewed visit; re-sent clindamycin per plan from ED team that provided care of the patient for the condition in question. I did not personally evaluate or d/w the pt. Plan relayed to Network engineer.
